# Patient Record
Sex: Male | Born: 1970 | Race: White | Hispanic: No | Marital: Single | Smoking: Current every day smoker
Health system: Southern US, Community
[De-identification: ages and names within clinical notes are randomized; demographics above are authoritative.]

## PROBLEM LIST (undated history)

## (undated) DIAGNOSIS — E119 Type 2 diabetes mellitus without complications: Secondary | ICD-10-CM

## (undated) HISTORY — PX: HERNIA REPAIR: SHX51

---

## 2015-10-20 ENCOUNTER — Encounter (HOSPITAL_COMMUNITY): Payer: Self-pay | Admitting: Emergency Medicine

## 2015-10-20 ENCOUNTER — Emergency Department (HOSPITAL_COMMUNITY): Payer: Self-pay

## 2015-10-20 DIAGNOSIS — R531 Weakness: Secondary | ICD-10-CM | POA: Insufficient documentation

## 2015-10-20 DIAGNOSIS — R0602 Shortness of breath: Secondary | ICD-10-CM | POA: Insufficient documentation

## 2015-10-20 DIAGNOSIS — R062 Wheezing: Secondary | ICD-10-CM | POA: Insufficient documentation

## 2015-10-20 DIAGNOSIS — R630 Anorexia: Secondary | ICD-10-CM | POA: Insufficient documentation

## 2015-10-20 DIAGNOSIS — R509 Fever, unspecified: Secondary | ICD-10-CM | POA: Insufficient documentation

## 2015-10-20 DIAGNOSIS — Z79899 Other long term (current) drug therapy: Secondary | ICD-10-CM | POA: Insufficient documentation

## 2015-10-20 DIAGNOSIS — F17210 Nicotine dependence, cigarettes, uncomplicated: Secondary | ICD-10-CM | POA: Insufficient documentation

## 2015-10-20 DIAGNOSIS — E860 Dehydration: Secondary | ICD-10-CM | POA: Insufficient documentation

## 2015-10-20 DIAGNOSIS — E119 Type 2 diabetes mellitus without complications: Secondary | ICD-10-CM | POA: Insufficient documentation

## 2015-10-20 DIAGNOSIS — R52 Pain, unspecified: Secondary | ICD-10-CM | POA: Insufficient documentation

## 2015-10-20 NOTE — ED Notes (Signed)
Pt. reports persistent productive cough , chest congestion , SOB , post nasal drip , fever and body aches onset last Thursday .

## 2015-10-21 ENCOUNTER — Emergency Department (HOSPITAL_COMMUNITY)
Admission: EM | Admit: 2015-10-21 | Discharge: 2015-10-21 | Disposition: A | Discharge status: Home / Self Care | Payer: Self-pay | Attending: Emergency Medicine | Admitting: Emergency Medicine

## 2015-10-21 DIAGNOSIS — R6889 Other general symptoms and signs: Secondary | ICD-10-CM

## 2015-10-21 DIAGNOSIS — E860 Dehydration: Secondary | ICD-10-CM

## 2015-10-21 HISTORY — DX: Type 2 diabetes mellitus without complications: E11.9

## 2015-10-21 LAB — CBC WITH DIFFERENTIAL/PLATELET
Basophils Absolute: 0 10*3/uL (ref 0.0–0.1)
Basophils Relative: 0 %
Eosinophils Absolute: 0 10*3/uL (ref 0.0–0.7)
Eosinophils Relative: 0 %
HCT: 52.6 % — ABNORMAL HIGH (ref 39.0–52.0)
HEMOGLOBIN: 18.2 g/dL — AB (ref 13.0–17.0)
LYMPHS ABS: 1.7 10*3/uL (ref 0.7–4.0)
Lymphocytes Relative: 17 %
MCH: 31.1 pg (ref 26.0–34.0)
MCHC: 34.6 g/dL (ref 30.0–36.0)
MCV: 89.8 fL (ref 78.0–100.0)
Monocytes Absolute: 1 10*3/uL (ref 0.1–1.0)
Monocytes Relative: 10 %
NEUTROS PCT: 73 %
Neutro Abs: 7.4 10*3/uL (ref 1.7–7.7)
Platelets: 206 10*3/uL (ref 150–400)
RBC: 5.86 MIL/uL — AB (ref 4.22–5.81)
RDW: 13.5 % (ref 11.5–15.5)
WBC: 10.1 10*3/uL (ref 4.0–10.5)

## 2015-10-21 LAB — BASIC METABOLIC PANEL
Anion gap: 20 — ABNORMAL HIGH (ref 5–15)
BUN: 18 mg/dL (ref 6–20)
CHLORIDE: 94 mmol/L — AB (ref 101–111)
CO2: 20 mmol/L — AB (ref 22–32)
Calcium: 9.6 mg/dL (ref 8.9–10.3)
Creatinine, Ser: 1.39 mg/dL — ABNORMAL HIGH (ref 0.61–1.24)
GFR calc Af Amer: >60 mL/min (ref >60)
GFR calc non Af Amer: 59 mL/min — ABNORMAL LOW (ref >60)
Glucose, Bld: 91 mg/dL (ref 65–99)
POTASSIUM: 3.9 mmol/L (ref 3.5–5.1)
Sodium: 134 mmol/L — ABNORMAL LOW (ref 135–145)

## 2015-10-21 LAB — I-STAT CHEM 8, ED
BUN: 24 mg/dL — AB (ref 6–20)
CALCIUM ION: 0.92 mmol/L — AB (ref 1.12–1.23)
CHLORIDE: 104 mmol/L (ref 101–111)
CREATININE: 0.8 mg/dL (ref 0.61–1.24)
GLUCOSE: 119 mg/dL — AB (ref 65–99)
HCT: 48 % (ref 39.0–52.0)
Hemoglobin: 16.3 g/dL (ref 13.0–17.0)
Potassium: 3.6 mmol/L (ref 3.5–5.1)
Sodium: 134 mmol/L — ABNORMAL LOW (ref 135–145)
TCO2: 19 mmol/L (ref 0–100)

## 2015-10-21 MED ORDER — BENZONATATE 100 MG PO CAPS: 100.0000 mg | ORAL_CAPSULE | Freq: Two times a day (BID) | ORAL | Status: AC | PRN

## 2015-10-21 MED ORDER — ALBUTEROL SULFATE HFA 108 (90 BASE) MCG/ACT IN AERS
2.0000 | INHALATION_SPRAY | Freq: Once | RESPIRATORY_TRACT | Status: AC
  Administered 2015-10-21: 2 via RESPIRATORY_TRACT
  Filled 2015-10-21: qty 6.7

## 2015-10-21 MED ORDER — DEXAMETHASONE SODIUM PHOSPHATE 10 MG/ML IJ SOLN
10.0000 mg | Freq: Once | INTRAMUSCULAR | Status: AC
  Administered 2015-10-21: 10 mg via INTRAVENOUS
  Filled 2015-10-21: qty 1

## 2015-10-21 MED ORDER — ONDANSETRON HCL 4 MG/2ML IJ SOLN
4.0000 mg | Freq: Once | INTRAMUSCULAR | Status: AC
  Administered 2015-10-21: 4 mg via INTRAVENOUS
  Filled 2015-10-21: qty 2

## 2015-10-21 MED ORDER — AZITHROMYCIN 250 MG PO TABS: 250.0000 mg | ORAL_TABLET | Freq: Every day | ORAL | Status: AC

## 2015-10-21 MED ORDER — SODIUM CHLORIDE 0.9 % IV BOLUS (SEPSIS)
1000.0000 mL | Freq: Once | INTRAVENOUS | Status: AC
Start: 2015-10-21 — End: 2015-10-21
  Administered 2015-10-21: 1000 mL via INTRAVENOUS

## 2015-10-21 MED ORDER — SODIUM CHLORIDE 0.9 % IV BOLUS (SEPSIS)
1000.0000 mL | Freq: Once | INTRAVENOUS | Status: AC
  Administered 2015-10-21: 1000 mL via INTRAVENOUS

## 2015-10-21 MED ORDER — ALBUTEROL SULFATE HFA 108 (90 BASE) MCG/ACT IN AERS: 2.0000 | INHALATION_SPRAY | Freq: Once | RESPIRATORY_TRACT | Status: AC

## 2015-10-21 MED ORDER — BENZONATATE 100 MG PO CAPS
100.0000 mg | ORAL_CAPSULE | Freq: Once | ORAL | Status: AC
  Administered 2015-10-21: 100 mg via ORAL
  Filled 2015-10-21: qty 1

## 2015-10-21 MED ORDER — ONDANSETRON 4 MG PO TBDP: 4.0000 mg | ORAL_TABLET | Freq: Three times a day (TID) | ORAL | Status: AC | PRN

## 2015-10-21 NOTE — ED Notes (Signed)
Starting SpO2: 93% RA Starting HR: 81 Pt ambulated around nurses station with unsteady gait reporting he felt lightheaded; pt appeared pale in color; RN offered to take patient back to room but patient persisted to continue Ending SpO2: 90% RA Ending HR: 114 pts breathing labored and tachypnic

## 2015-10-21 NOTE — ED Notes (Addendum)
Pt verbalized understanding of d/c instructions, prescriptions, and follow-up care. No further questions/concerns, VSS, offered wheelchair but pt refused. Ambulatory w/ steady gait to lobby.

## 2015-10-21 NOTE — ED Notes (Signed)
Horton, MD at bedside.  

## 2015-10-21 NOTE — ED Provider Notes (Signed)
CSN: [id number]     Arrival date & time 10/20/15  1927 History  By signing my name below, I, Rohini Rajnarayanan, attest that this documentation has been prepared under the direction and in the presence of Shon Baton, MD Electronically Signed: Charlean Merl, ED Scribe 10/21/2015 at 12:56 AM.    Chief Complaint  Patient presents with  . Cough  . Fever  . Generalized Body Aches   The history is provided by the patient. No language interpreter was used.   HPI Comments: Willie Adams is a 45 y.o. male with DM, HLD and HTN, who presents to the Emergency Department complaining of extreme weakness, productive cough, chills, loss of appetite, SOB and CP/chest congestion which began 2/2. Pt also reports associated diarrhea, difficulty urinating and a subjective fever with an unknown temperature. Pt has had sick contacts including his son who was exposed to sick contacts as well. Pt tried OTC medications with no relief. Pt is a former smoker.   Past Medical History  Diagnosis Date  . Diabetes mellitus without complication [facility])    Past Surgical History  Procedure Laterality Date  . Hernia repair     No family history on file. Social History  Substance Use Topics  . Smoking status: Current Every Day Smoker  . Smokeless tobacco: Not on file  . Alcohol Use: No    Review of Systems  Constitutional: Positive for fever, chills and appetite change.  Respiratory: Positive for cough (Productive) and shortness of breath.   Cardiovascular: Positive for chest pain.  Gastrointestinal: Positive for diarrhea.  Genitourinary: Positive for difficulty urinating.  Neurological: Positive for weakness.      Allergies  Review of patient's allergies indicates no known allergies.  Home Medications   Prior to Admission medications   Medication Sig Start Date End Date Taking? Authorizing Provider  ibuprofen (ADVIL,MOTRIN) 200 MG tablet Take 400-800 mg by mouth every 6 (six) hours as needed for  fever or moderate pain.   Yes Historical Provider, MD  Phenylephrine-APAP-Guaifenesin (MUCINEX FAST-MAX COLD & SINUS) 5-325-200 MG TABS Take 2 capsules by mouth 3 (three) times daily as needed (cold).   Yes Historical Provider, MD  Pseudoeph-Doxylamine-DM-APAP (NYQUIL PO) Take 30 mLs by mouth at bedtime as needed (cold).   Yes Historical Provider, MD  Pseudoephedrine-APAP-DM (DAYQUIL PO) Take 30 mLs by mouth 2 (two) times daily as needed (cold).   Yes Historical Provider, MD  albuterol (PROVENTIL HFA;VENTOLIN HFA) 108 (90 Base) MCG/ACT inhaler Inhale 2 puffs into the lungs once. 10/21/15   Shon Baton, MD  azithromycin (ZITHROMAX) 250 MG tablet Take 1 tablet (250 mg total) by mouth daily. Take first 2 tablets together, then 1 every day until finished. 10/21/15   Shon Baton, MD  benzonatate (TESSALON) 100 MG capsule Take 1 capsule (100 mg total) by mouth 2 (two) times daily as needed for cough. 10/21/15   Shon Baton, MD  ondansetron (ZOFRAN ODT) 4 MG disintegrating tablet Take 1 tablet (4 mg total) by mouth every 8 (eight) hours as needed for nausea or vomiting. 10/21/15   Shon Baton, MD   BP 117/76 mmHg  Pulse 95  Temp(Src) 97.9 F (36.6 C) (Oral)  Resp 16  SpO2 96% Physical Exam  Constitutional: He is oriented to person, place, and time. He appears well-developed and well-nourished.  Ill-appearing but nontoxic  HENT:  Head: Normocephalic and atraumatic.  Mouth/Throat: No oropharyngeal exudate.  Mucous membranes dry  Cardiovascular: Normal rate, regular rhythm and normal heart  sounds.   No murmur heard. Pulmonary/Chest: Effort normal. No respiratory distress. He has wheezes.  Scant expiratory wheezing  Abdominal: Soft. Bowel sounds are normal. There is no tenderness. There is no rebound.  Musculoskeletal: He exhibits no edema.  Lymphadenopathy:    He has no cervical adenopathy.  Neurological: He is alert and oriented to person, place, and time.  Skin: Skin is warm  and dry.  Psychiatric: He has a normal mood and affect.  Nursing note and vitals reviewed.   ED Course  Procedures  DIAGNOSTIC STUDIES: Oxygen Saturation is 96% on RA, adequate by my interpretation.    COORDINATION OF CARE: 12:39 AM-Discussed treatment plan which includes DG chest and an EKG,  with pt at bedside and pt agreed to plan.    Labs Review Labs Reviewed  CBC WITH DIFFERENTIAL/PLATELET - Abnormal; Notable for the following:    RBC 5.86 (*)    Hemoglobin 18.2 (*)    HCT 52.6 (*)    All other components within normal limits  BASIC METABOLIC PANEL - Abnormal; Notable for the following:    Sodium 134 (*)    Chloride 94 (*)    CO2 20 (*)    Creatinine, Ser 1.39 (*)    GFR calc non Af Amer 59 (*)    Anion gap 20 (*)    All other components within normal limits  I-STAT CHEM 8, ED - Abnormal; Notable for the following:    Sodium 134 (*)    BUN 24 (*)    Glucose, Bld 119 (*)    Calcium, Ion 0.92 (*)    All other components within normal limits    Imaging Review Dg Chest 2 View  10/20/2015  CLINICAL DATA:  Productive cough with fever. EXAM: CHEST  2 VIEW COMPARISON:  None. FINDINGS: The heart size and mediastinal contours are within normal limits. Both lungs are clear. The visualized skeletal structures are unremarkable. Moderate hyperinflation consistent with COPD. IMPRESSION: No active cardiopulmonary disease.  COPD. Electronically Signed   By: Elsie Stain M.D.   On: 10/20/2015 20:17   I have personally reviewed and evaluated these images and lab results as part of my medical decision-making.   EKG Interpretation   Date/Time:  Monday October 20 2015 19:31:21 EST Ventricular Rate:  110 PR Interval:  144 QRS Duration: 78 QT Interval:  312 QTC Calculation: 422 R Axis:   -67 Text Interpretation:  Sinus tachycardia Right atrial enlargement Left axis  deviation Pulmonary disease pattern Septal infarct , age undetermined  Abnormal ECG No prior for comparison  Confirmed by Fayne Mcguffee  MD, Auron Tadros  ([id number]) on 10/21/2015 12:29:35 AM      MDM   Final diagnoses:  Flu-like symptoms  Dehydration   Patient presents with flulike symptoms. Reports chills but no documented fevers. Was sent from urgent care. Influenza swab at urgent care was negative. He is ill-appearing but nontoxic.  Scant wheezing on exam but otherwise reassuring.  Patient was given fluids. Initial lab work notable for acute kidney injury with an anion gap of 20. This is likely because patient's chloride is 94.  No leukocytosis. Patient does appear dehydrated and dry.  Discussed with patient that he likely has a viral illness. This could be the flu but he is out of the window for Tamiflu. Upon initial discharge, patient reported continued symptoms.    He was able to ambulate and maintain pulse ox but reported continued shortness of breath and tachypnea. He was given an additional liter of  fluids. He was also given an inhaler given his smoking history.  Repeat chem 8 with improved anion gap and chloride.  Again discussed with the patient that this is likely a viral illness. However, given his smoking history we'll discharge with azithromycin and an inhaler. He was also given Tessalon Perles and Zofran. Discussed with patient further symptom control at home.  After history, exam, and medical workup I feel the patient has been appropriately medically screened and is safe for discharge home. Pertinent diagnoses were discussed with the patient. Patient was given return precautions.   I personally performed the services described in this documentation, which was scribed in my presence. The recorded information has been reviewed and is accurate.      Shon Baton, MD 10/21/15 [phone]

## 2015-10-21 NOTE — ED Notes (Signed)
RN presented with D/C instructions; pt reporting "I dont feel any better"; MD made aware of same

## 2015-10-21 NOTE — Discharge Instructions (Signed)
Viral Infections °A viral infection can be caused by different types of viruses. Most viral infections are not serious and resolve on their own. However, some infections may cause severe symptoms and may lead to further complications. °SYMPTOMS °Viruses can frequently cause: °· Minor sore throat. °· Aches and pains. °· Headaches. °· Runny nose. °· Different types of rashes. °· Watery eyes. °· Tiredness. °· Cough. °· Loss of appetite. °· Gastrointestinal infections, resulting in nausea, vomiting, and diarrhea. °These symptoms do not respond to antibiotics because the infection is not caused by bacteria. However, you might catch a bacterial infection following the viral infection. This is sometimes called a "superinfection." Symptoms of such a bacterial infection may include: °· Worsening sore throat with pus and difficulty swallowing. °· Swollen neck glands. °· Chills and a high or persistent fever. °· Severe headache. °· Tenderness over the sinuses. °· Persistent overall ill feeling (malaise), muscle aches, and tiredness (fatigue). °· Persistent cough. °· Yellow, green, or brown mucus production with coughing. °HOME CARE INSTRUCTIONS  °· Only take over-the-counter or prescription medicines for pain, discomfort, diarrhea, or fever as directed by your caregiver. °· Drink enough water and fluids to keep your urine clear or pale yellow. Sports drinks can provide valuable electrolytes, sugars, and hydration. °· Get plenty of rest and maintain proper nutrition. Soups and broths with crackers or rice are fine. °SEEK IMMEDIATE MEDICAL CARE IF:  °· You have severe headaches, shortness of breath, chest pain, neck pain, or an unusual rash. °· You have uncontrolled vomiting, diarrhea, or you are unable to keep down fluids. °· You or your child has an oral temperature above 102° F (38.9° C), not controlled by medicine. °· Your baby is older than 3 months with a rectal temperature of 102° F (38.9° C) or higher. °· Your baby is 3  months old or younger with a rectal temperature of 100.4° F (38° C) or higher. °MAKE SURE YOU:  °· Understand these instructions. °· Will watch your condition. °· Will get help right away if you are not doing well or get worse. °  °This information is not intended to replace advice given to you by your health care provider. Make sure you discuss any questions you have with your health care provider. °  °Document Released: 06/09/2005 Document Revised: 11/22/2011 Document Reviewed: 02/05/2015 °Elsevier Interactive Patient Education ©2016 [employer] ° °

## 2016-11-09 IMAGING — DX DG CHEST 2V
2 series · 2 of 2 positions shown · non-contrast
Comparison: None.

CLINICAL DATA: Productive cough with fever.

EXAM:
CHEST  2 VIEW

[chest pa]
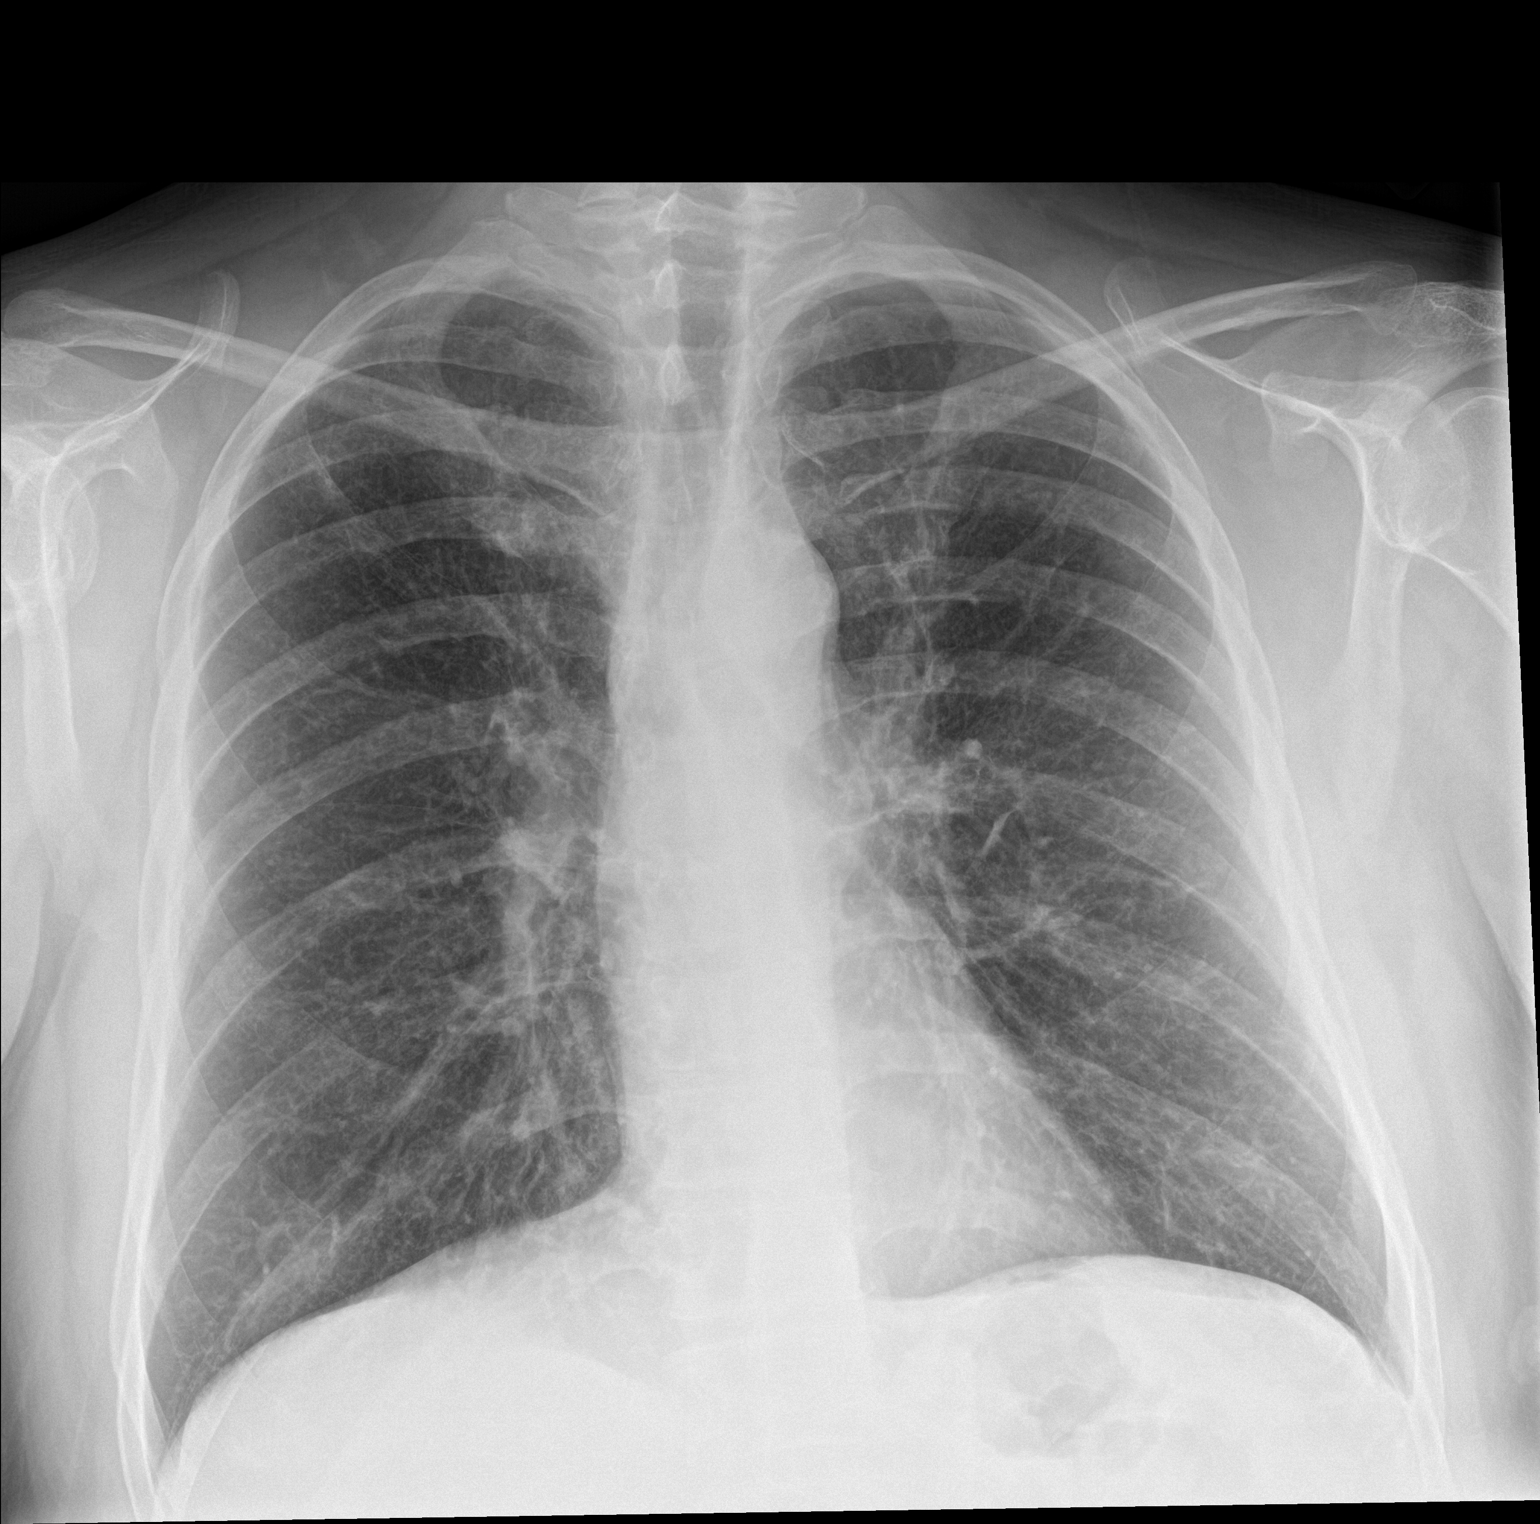

[chest lat]
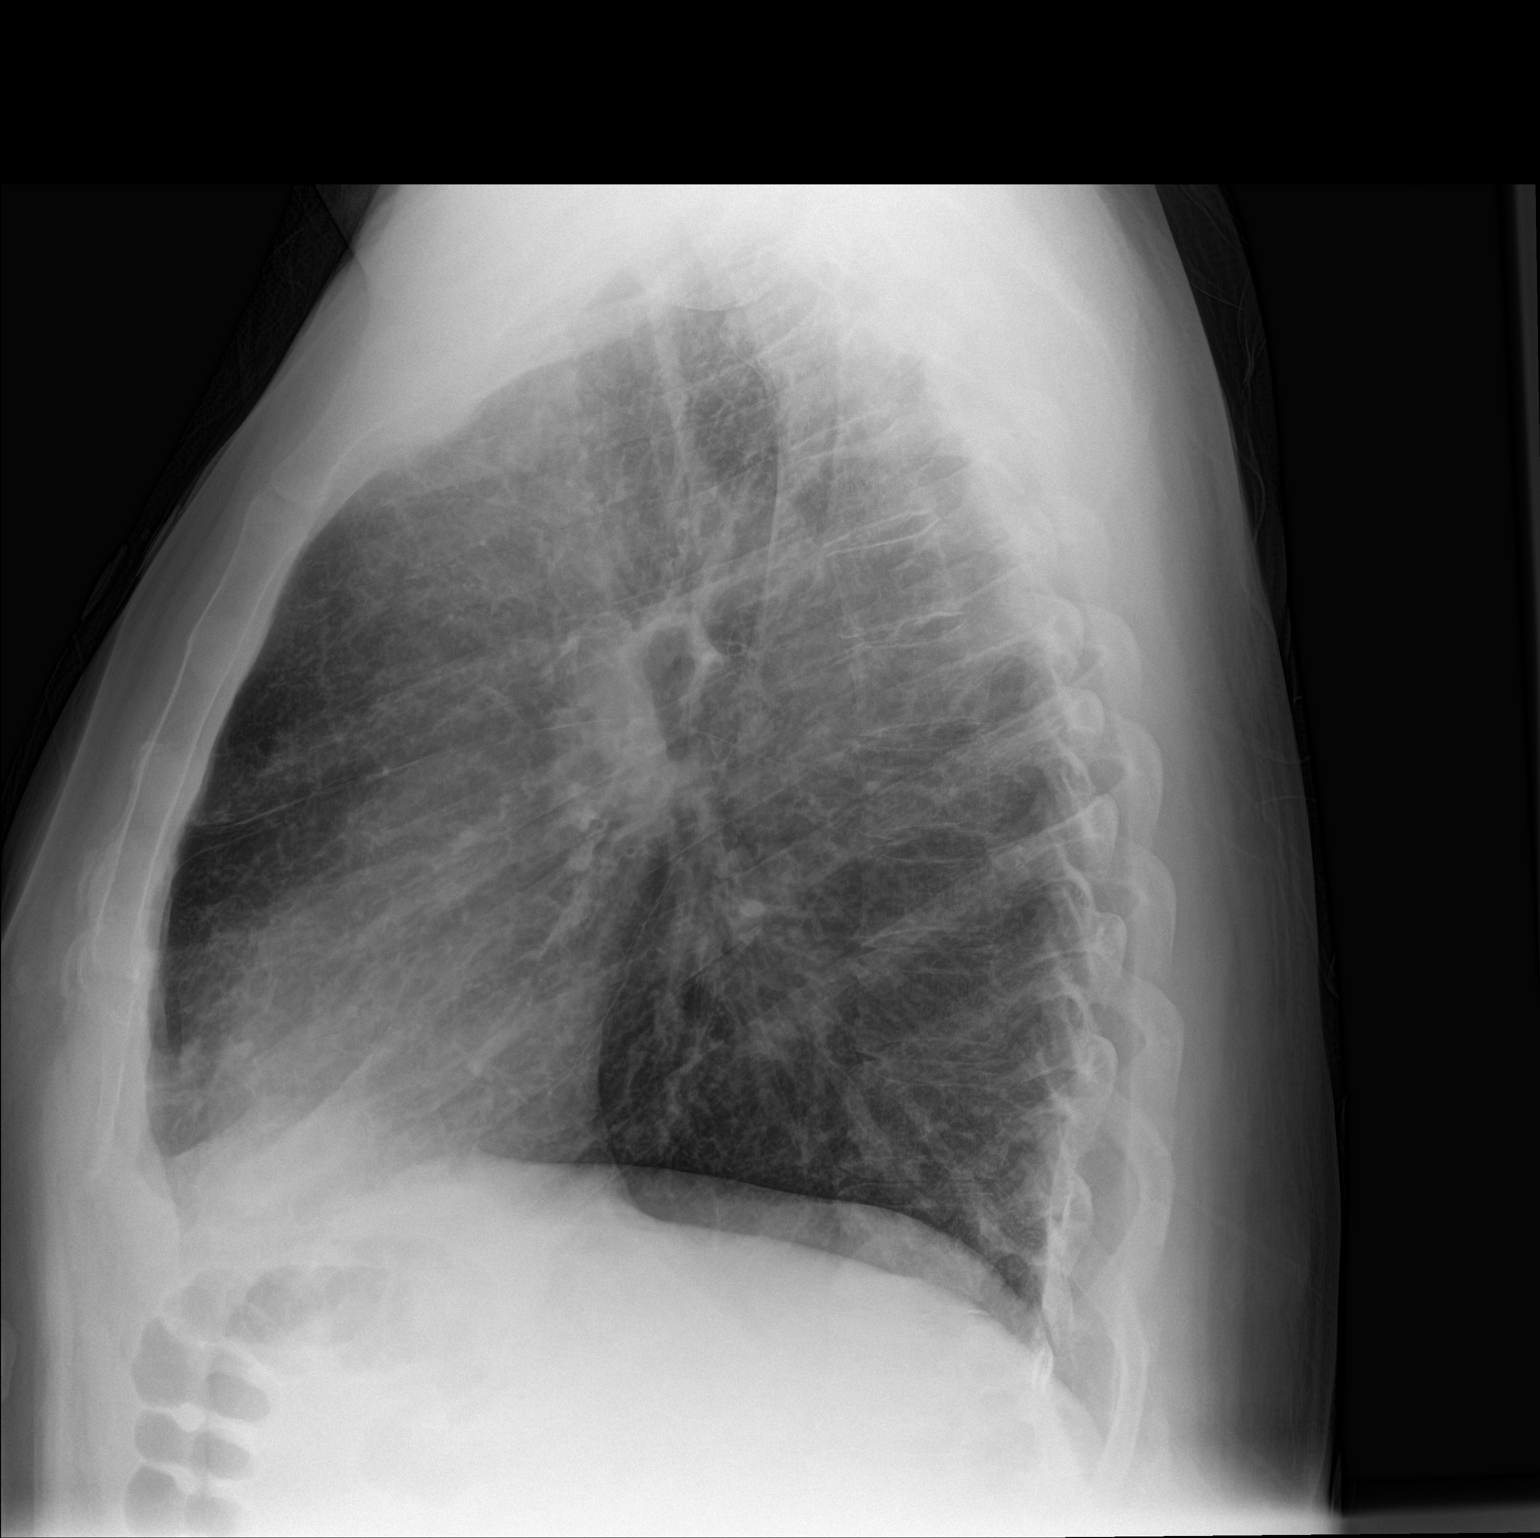

[2 of 2 positions shown; findings below may reference images not displayed]

FINDINGS: The heart size and mediastinal contours are within normal limits.
Both lungs are clear. The visualized skeletal structures are
unremarkable. Moderate hyperinflation consistent with COPD.
IMPRESSION: No active cardiopulmonary disease.  COPD.
